# Patient Record
Sex: Male | Born: 1975 | Hispanic: No | Marital: Single | Smoking: Never smoker
Health system: Southern US, Community
[De-identification: ages and names within clinical notes are randomized; demographics above are authoritative.]

## PROBLEM LIST (undated history)

## (undated) DIAGNOSIS — E119 Type 2 diabetes mellitus without complications: Secondary | ICD-10-CM

---

## 2008-02-12 ENCOUNTER — Emergency Department (HOSPITAL_COMMUNITY): Admission: EM | Admit: 2008-02-12 | Discharge: 2008-02-12 | Payer: Self-pay | Admitting: Emergency Medicine

## 2011-01-23 LAB — GLUCOSE, CAPILLARY: Glucose-Capillary: 298 — ABNORMAL HIGH

## 2013-01-23 ENCOUNTER — Emergency Department (HOSPITAL_COMMUNITY)
Admission: EM | Admit: 2013-01-23 | Discharge: 2013-01-23 | Disposition: A | Discharge status: Home / Self Care | Payer: Self-pay | Source: Home / Self Care | Attending: Emergency Medicine | Admitting: Emergency Medicine

## 2013-01-23 ENCOUNTER — Encounter (HOSPITAL_COMMUNITY): Payer: Self-pay | Admitting: Emergency Medicine

## 2013-01-23 DIAGNOSIS — L03039 Cellulitis of unspecified toe: Secondary | ICD-10-CM

## 2013-01-23 DIAGNOSIS — L03032 Cellulitis of left toe: Secondary | ICD-10-CM

## 2013-01-23 MED ORDER — CEPHALEXIN 500 MG PO CAPS: 500.0000 mg | ORAL_CAPSULE | Freq: Three times a day (TID) | ORAL | Status: AC

## 2013-01-23 MED ORDER — TRAMADOL HCL 50 MG PO TABS: 100.0000 mg | ORAL_TABLET | Freq: Three times a day (TID) | ORAL | Status: AC | PRN

## 2013-01-23 NOTE — ED Provider Notes (Signed)
Chief Complaint:   Chief Complaint  Patient presents with  . Toe Pain    History of Present Illness:   Ralph Bryant is a 37 year old male who has had a two-day history of pain, swelling, and tenderness to palpation of the medial nail fold of the left great toe. He denies any injury. It hurts to touch. There's been no purulent drainage. He's had no fever or chills, no history of gout, and no other joint involvement.  Review of Systems:  Other than noted above, the patient denies any of the following symptoms: Systemic:  No fevers, chills, or sweats.  No fatigue or tiredness. Musculoskeletal:  No joint pain, arthritis, bursitis, swelling, or back pain.  Neurological:  No muscular weakness, paresthesias.  PMFSH:  Past medical history, family history, social history, meds, and allergies were reviewed.  No history of gout.    Physical Exam:   Vital signs:  BP 133/90  Pulse 88  Temp(Src) 98.3 F (36.8 C) (Oral)  Resp 20  SpO2 100% Gen:  Alert and oriented times 3.  In no distress. Musculoskeletal:  Exam of the foot reveals a paronychia of the medial nail fold of the left great toe. There is a visible collection of pus at the corner of the nail fold. This was very tender to touch. There was no pain to palpation over the IP joint or the MTP joint foot exam was otherwise normal. There was no pus underneath the nail.  Otherwise, all joints had a full a ROM with no swelling, bruising or deformity.  No edema, pulses full. Extremities were warm and pink.  Capillary refill was brisk.  Skin:  Clear, warm and dry.  No rash. Neuro:  Alert and oriented times 3.  Muscle strength was normal.  Sensation was intact to light touch.   Procedure Note:  Verbal informed consent was obtained from the patient.  Risks and benefits were outlined with the patient.  Patient understands and accepts these risks.  Identity of the patient was confirmed verbally and by armband.    Procedure was performed as follows:  The  toe was prepped with alcohol. A single stab incision was made into the visible collection of pus using a #11 scalpel blade. A large amount of pus drained out. This was cultured. Antibiotic ointment and a Band-Aid dressing were applied.  Patient tolerated the procedure well without any immediate complications.  Assessment:  The encounter diagnosis was Paronychia of great toe of left foot.  Plan:   1.  Meds:  The following meds were prescribed:   Discharge Medication List as of 01/23/2013  2:48 PM    START taking these medications   Details  cephALEXin (KEFLEX) 500 MG capsule Take 1 capsule (500 mg total) by mouth 3 (three) times daily., Starting 01/23/2013, Until Discontinued, Normal    traMADol (ULTRAM) 50 MG tablet Take 2 tablets (100 mg total) by mouth every 8 (eight) hours as needed for pain., Starting 01/23/2013, Until Discontinued, Normal        2.  Patient Education/Counseling:  The patient was given appropriate handouts, self care instructions, and instructed in symptomatic relief including rest and activity, elevation, soaking in warm water, and application of antibiotic ointment.  3.  Follow up:  The patient was told to follow up if no better in 3 to 4 days, if becoming worse in any way, and given some red flag symptoms such as worsening pain, swelling, or fever which would prompt immediate return.  Follow up  here if necessary.       Reuben Likes, MD 01/23/13 [phone]

## 2013-01-23 NOTE — ED Notes (Signed)
Left great toe and pain, slight redness to toe.  Noticed pain 2 days ago

## 2013-01-27 LAB — CULTURE, ROUTINE-ABSCESS

## 2013-01-27 NOTE — ED Notes (Signed)
Abscess R toe: Few Staph Aureus.  Dr. Lorenz Coaster confirmed that pt. was adequately treated with Keflex. Ralph Bryant 01/27/2013

## 2019-10-03 ENCOUNTER — Emergency Department (HOSPITAL_COMMUNITY)
Admission: EM | Admit: 2019-10-03 | Discharge: 2019-10-03 | Disposition: A | Discharge status: Home / Self Care | Payer: Self-pay | Attending: Emergency Medicine | Admitting: Emergency Medicine

## 2019-10-03 ENCOUNTER — Encounter (HOSPITAL_COMMUNITY): Payer: Self-pay

## 2019-10-03 ENCOUNTER — Emergency Department (HOSPITAL_COMMUNITY): Payer: Self-pay

## 2019-10-03 ENCOUNTER — Other Ambulatory Visit: Payer: Self-pay

## 2019-10-03 DIAGNOSIS — R1084 Generalized abdominal pain: Secondary | ICD-10-CM

## 2019-10-03 DIAGNOSIS — I10 Essential (primary) hypertension: Secondary | ICD-10-CM

## 2019-10-03 DIAGNOSIS — E1165 Type 2 diabetes mellitus with hyperglycemia: Secondary | ICD-10-CM | POA: Insufficient documentation

## 2019-10-03 DIAGNOSIS — Z20822 Contact with and (suspected) exposure to covid-19: Secondary | ICD-10-CM | POA: Insufficient documentation

## 2019-10-03 DIAGNOSIS — Z7984 Long term (current) use of oral hypoglycemic drugs: Secondary | ICD-10-CM | POA: Insufficient documentation

## 2019-10-03 DIAGNOSIS — R739 Hyperglycemia, unspecified: Secondary | ICD-10-CM

## 2019-10-03 DIAGNOSIS — Z79899 Other long term (current) drug therapy: Secondary | ICD-10-CM | POA: Insufficient documentation

## 2019-10-03 DIAGNOSIS — R509 Fever, unspecified: Secondary | ICD-10-CM

## 2019-10-03 HISTORY — DX: Type 2 diabetes mellitus without complications: E11.9

## 2019-10-03 LAB — CBC
HCT: 43.3 % (ref 39.0–52.0)
Hemoglobin: 14.5 g/dL (ref 13.0–17.0)
MCH: 27.4 pg (ref 26.0–34.0)
MCHC: 33.5 g/dL (ref 30.0–36.0)
MCV: 81.7 fL (ref 80.0–100.0)
Platelets: 223 10*3/uL (ref 150–400)
RBC: 5.3 MIL/uL (ref 4.22–5.81)
RDW: 12.4 % (ref 11.5–15.5)
WBC: 5.7 10*3/uL (ref 4.0–10.5)
nRBC: 0 % (ref 0.0–0.2)

## 2019-10-03 LAB — COMPREHENSIVE METABOLIC PANEL
ALT: 26 U/L (ref 0–44)
AST: 24 U/L (ref 15–41)
Albumin: 3.5 g/dL (ref 3.5–5.0)
Alkaline Phosphatase: 95 U/L (ref 38–126)
Anion gap: 13 (ref 5–15)
BUN: 12 mg/dL (ref 6–20)
CO2: 20 mmol/L — ABNORMAL LOW (ref 22–32)
Calcium: 9.4 mg/dL (ref 8.9–10.3)
Chloride: 100 mmol/L (ref 98–111)
Creatinine, Ser: 0.93 mg/dL (ref 0.61–1.24)
GFR calc Af Amer: >60 mL/min (ref >60)
GFR calc non Af Amer: >60 mL/min (ref >60)
Glucose, Bld: 323 mg/dL — ABNORMAL HIGH (ref 70–99)
Potassium: 4 mmol/L (ref 3.5–5.1)
Sodium: 133 mmol/L — ABNORMAL LOW (ref 135–145)
Total Bilirubin: 0.6 mg/dL (ref 0.3–1.2)
Total Protein: 7.6 g/dL (ref 6.5–8.1)

## 2019-10-03 LAB — URINALYSIS, ROUTINE W REFLEX MICROSCOPIC
Bacteria, UA: NONE SEEN
Bilirubin Urine: NEGATIVE
Glucose, UA: >=500 mg/dL — AB
Hgb urine dipstick: NEGATIVE
Ketones, ur: NEGATIVE mg/dL
Leukocytes,Ua: NEGATIVE
Nitrite: NEGATIVE
Protein, ur: 100 mg/dL — AB
Specific Gravity, Urine: 1.038 — ABNORMAL HIGH (ref 1.005–1.030)
pH: 5 (ref 5.0–8.0)

## 2019-10-03 LAB — LIPASE, BLOOD: Lipase: 19 U/L (ref 11–51)

## 2019-10-03 LAB — SARS CORONAVIRUS 2 BY RT PCR (HOSPITAL ORDER, PERFORMED IN ~~LOC~~ HOSPITAL LAB): SARS Coronavirus 2: NEGATIVE

## 2019-10-03 MED ORDER — LACTATED RINGERS IV BOLUS
1000.0000 mL | Freq: Once | INTRAVENOUS | Status: AC
Start: 2019-10-03 — End: 2019-10-03
  Administered 2019-10-03: 1000 mL via INTRAVENOUS

## 2019-10-03 MED ORDER — IOHEXOL 300 MG/ML  SOLN
100.0000 mL | Freq: Once | INTRAMUSCULAR | Status: AC | PRN
  Administered 2019-10-03: 100 mL via INTRAVENOUS

## 2019-10-03 MED ORDER — SODIUM CHLORIDE 0.9% FLUSH
3.0000 mL | Freq: Once | INTRAVENOUS | Status: AC
  Administered 2019-10-03: 3 mL via INTRAVENOUS

## 2019-10-03 MED ORDER — ONDANSETRON HCL 4 MG/2ML IJ SOLN
4.0000 mg | Freq: Once | INTRAMUSCULAR | Status: AC
  Administered 2019-10-03: 4 mg via INTRAVENOUS
  Filled 2019-10-03: qty 2

## 2019-10-03 MED ORDER — LACTATED RINGERS IV BOLUS
500.0000 mL | Freq: Once | INTRAVENOUS | Status: AC
  Administered 2019-10-03: 500 mL via INTRAVENOUS

## 2019-10-03 MED ORDER — FENTANYL CITRATE (PF) 100 MCG/2ML IJ SOLN
50.0000 ug | Freq: Once | INTRAMUSCULAR | Status: AC
  Administered 2019-10-03: 50 ug via INTRAVENOUS
  Filled 2019-10-03: qty 2

## 2019-10-03 MED ORDER — ACETAMINOPHEN 325 MG PO TABS
650.0000 mg | ORAL_TABLET | Freq: Once | ORAL | Status: AC
  Administered 2019-10-03: 650 mg via ORAL
  Filled 2019-10-03: qty 2

## 2019-10-03 NOTE — ED Provider Notes (Signed)
[facility] EMERGENCY DEPARTMENT Provider Note   CSN: [id number] Arrival date & time: 10/03/19  1637     History Chief Complaint  Patient presents with  . Abdominal Pain    Ralph Bryant is a 44 y.o. male.  Patient presented to the ED with several days of abdominal pain, was found to be febrile in triage.  Patient denies any nausea, vomiting.  Patient states he may have some intermittent loose stools or constipation.  The history is provided by the patient.  Illness Location:  Abdomen Quality:  Pain Severity:  Mild Onset quality:  Gradual Timing:  Constant Progression:  Unchanged Chronicity:  New Context:  No specific etiology, no history of abdominal surgeries Relieved by:  Nothing tried Worsened by:  Nothing Ineffective treatments:  None tried Associated symptoms: abdominal pain and diarrhea   Associated symptoms: no chest pain, no congestion, no cough, no fever, no headaches, no loss of consciousness, no nausea, no shortness of breath and no vomiting        Past Medical History:  Diagnosis Date  . Diabetes mellitus without complication (HCC)     There are no problems to display for this patient.   History reviewed. No pertinent surgical history.     No family history on file.  Social History   Tobacco Use  . Smoking status: Never Smoker  Substance Use Topics  . Alcohol use: No  . Drug use: No    Home Medications Prior to Admission medications   Medication Sig Start Date End Date Taking? Authorizing Provider  metFORMIN (GLUCOPHAGE-XR) 500 MG 24 hr tablet Take 1,000 mg by mouth 2 (two) times daily. 09/22/19  Yes [provider]  cephALEXin (KEFLEX) 500 MG capsule Take 1 capsule (500 mg total) by mouth 3 (three) times daily. Patient not taking: Reported on 10/03/2019 01/23/13   Reuben Likes, MD  traMADol (ULTRAM) 50 MG tablet Take 2 tablets (100 mg total) by mouth every 8 (eight) hours as needed for pain. Patient not taking:  Reported on 10/03/2019 01/23/13   Reuben Likes, MD    Allergies    Patient has no known allergies.  Review of Systems   Review of Systems  Constitutional: Negative for fever.  HENT: Negative for congestion.   Respiratory: Negative for cough and shortness of breath.   Cardiovascular: Negative for chest pain.  Gastrointestinal: Positive for abdominal pain and diarrhea. Negative for nausea and vomiting.  Neurological: Negative for loss of consciousness and headaches.  All other systems reviewed and are negative.   Physical Exam Updated Vital Signs BP (!) 175/106   Pulse 99   Temp 99.9 F (37.7 C) (Oral)   Resp 20   SpO2 96%   Physical Exam Vitals and nursing note reviewed.  Constitutional:      General: He is not in acute distress.    Appearance: He is well-developed.    HENT:     Head: Normocephalic and atraumatic.  Eyes:     Conjunctiva/sclera: Conjunctivae normal.  Cardiovascular:     Rate and Rhythm: Normal rate and regular rhythm.     Heart sounds: No murmur heard.   Pulmonary:     Effort: Pulmonary effort is normal. No respiratory distress.     Breath sounds: Normal breath sounds.  Abdominal:     General: Bowel sounds are normal. There is no distension. There are no signs of injury.     Palpations: Abdomen is soft.     Tenderness: There  is no abdominal tenderness. There is no right CVA tenderness or left CVA tenderness.  Musculoskeletal:     Cervical back: Neck supple.  Skin:    General: Skin is warm and dry.  Neurological:     General: No focal deficit present.     Mental Status: He is alert and oriented to person, place, and time.  Psychiatric:        Mood and Affect: Mood normal.        Behavior: Behavior normal.     ED Results / Procedures / Treatments   Labs (all labs ordered are listed, but only abnormal results are displayed) Labs Reviewed  COMPREHENSIVE METABOLIC PANEL - Abnormal; Notable for the following components:      Result Value    Sodium 133 (*)    CO2 20 (*)    Glucose, Bld 323 (*)    All other components within normal limits  URINALYSIS, ROUTINE W REFLEX MICROSCOPIC - Abnormal; Notable for the following components:   Specific Gravity, Urine 1.038 (*)    Glucose, UA >=500 (*)    Protein, ur 100 (*)    All other components within normal limits  SARS CORONAVIRUS 2 BY RT PCR (HOSPITAL ORDER, PERFORMED IN [facility])  LIPASE, BLOOD  CBC    EKG None  Radiology CT ABDOMEN PELVIS W CONTRAST  Result Date: 10/03/2019 CLINICAL DATA:  Abdominal pain.  Fever. EXAM: CT ABDOMEN AND PELVIS WITH CONTRAST TECHNIQUE: Multidetector CT imaging of the abdomen and pelvis was performed using the standard protocol following bolus administration of intravenous contrast. CONTRAST:  OMNIPAQUE IOHEXOL 300 MG/ML  SOLN COMPARISON:  None. FINDINGS: Lower chest: The lung bases are clear. The heart size is normal. Hepatobiliary: The liver is enlarged measuring approximately 23 cm craniocaudad. There are findings suspicious for underlying hepatic steatosis. The gallbladder is unremarkable.There is no biliary ductal dilation. Pancreas: Normal contours without ductal dilatation. No peripancreatic fluid collection. Spleen: The spleen is enlarged measuring approximately 14 cm craniocaudad. Adrenals/Urinary Tract: --Adrenal glands: Unremarkable. --Right kidney/ureter: No hydronephrosis or radiopaque kidney stones. --Left kidney/ureter: No hydronephrosis or radiopaque kidney stones. --Urinary bladder: Unremarkable. Stomach/Bowel: --Stomach/Duodenum: There is a small hiatal hernia. --Small bowel: Unremarkable. --Colon: Unremarkable. --Appendix: Normal. Vascular/Lymphatic: Normal course and caliber of the major abdominal vessels. --No retroperitoneal lymphadenopathy. --No mesenteric lymphadenopathy. --No pelvic or inguinal lymphadenopathy. Reproductive: Unremarkable Other: No ascites or free air. There is a fat containing right inguinal  hernia. Musculoskeletal. No acute displaced fractures. There is a small subcutaneous defect in the patient's left gluteal region with some mild overlying skin thickening (axial series 3, image 82). IMPRESSION: 1. No acute abdominopelvic abnormality. 2. Small subcutaneous defect involving the left gluteal region with mild overlying skin thickening. Clinical correlation is recommended. There is no associated drainable fluid collection or mass. 3. Hepatosplenomegaly with findings suspicious for underlying hepatic steatosis. 4. Small hiatal hernia. 5. Fat containing right inguinal hernia. Electronically Signed   By: Katherine Mantle M.D.   On: 10/03/2019 21:46   DG Chest Portable 1 View  Result Date: 10/03/2019 CLINICAL DATA:  Fever EXAM: PORTABLE CHEST 1 VIEW COMPARISON:  None. FINDINGS: Low lung volumes. No confluent airspace opacities. Heart size is borderline. No effusions or acute bony abnormality. IMPRESSION: Low lung volumes.  No acute cardiopulmonary disease. Electronically Signed   By: Charlett Nose M.D.   On: 10/03/2019 22:41    Procedures Procedures (including critical care time)  Medications Ordered in ED Medications  sodium chloride flush (NS) 0.9 %  injection 3 mL (3 mLs Intravenous Given 10/03/19 2225)  acetaminophen (TYLENOL) tablet 650 mg (650 mg Oral Given 10/03/19 1654)  lactated ringers bolus 1,000 mL (0 mLs Intravenous Stopped 10/03/19 2225)  fentaNYL (SUBLIMAZE) injection 50 mcg (50 mcg Intravenous Given 10/03/19 2108)  ondansetron (ZOFRAN) injection 4 mg (4 mg Intravenous Given 10/03/19 2108)  iohexol (OMNIPAQUE) 300 MG/ML solution 100 mL (100 mLs Intravenous Contrast Given 10/03/19 2131)  lactated ringers bolus 500 mL (0 mLs Intravenous Stopped 10/03/19 2323)    ED Course  I have reviewed the triage vital signs and the nursing notes.  Pertinent labs & imaging results that were available during my care of the patient were reviewed by me and considered in my medical decision making  (see chart for details).    MDM Rules/Calculators/A&P                          Differential diagnosis: Fever in adult patient, viral illness, COVID-19, diverticulitis, appendicitis, kidney stone, cellulitis  ED physician interpretation of imaging: Patient chest x-ray out hemopneumothorax, widened mediastinum, enlarged heart border or focal pneumonia.  Patient CT without any acute findings to explain patient's pain. ED physician interpretation of labs: Patient CBC, CMP, lipase, COVID-19, urine all unremarkable for signs of systemic infection.  MDM: Patient is a 44 year old male presented to ED with several days of abdominal pain found to be febrile in triage with unremarkable lab work, chest x-ray and CT scan with fever from a likely viral cause with stable vital signs appropriate for discharge home with symptomatic treatment.  Patient's vital signs are stable, patient is febrile.  Patient's physical exam is unremarkable, no peritoneal signs.  Abdomen is soft with minimal to no tenderness.  No CVA tenderness.  Patient area concern on CT scan on the left gluteal region is an old scar as described in physical exam.  Patient's pain treated with fentanyl, fever with acetaminophen with improvement, patient given IV fluids.  Based on patient's general myalgias and fever without source most likely viral in nature.  Patient given strict return precautions, as well as recommendations for close outpatient follow-up for his chronic medical conditions including hypertension and hyperglycemia.  Key discharge instructions: Do not take your metformin for 48 hours after your CT scan.  Your lab work showed hyperglycemia without diabetic ketoacidosis.  Your CT of your abdomen and pelvis showed no acute pathology, no appendicitis, no diverticulitis, no kidney stone.  Your chest x-ray did not show any signs of upper respiratory infection.  Your urine showed no signs of infection.  You likely have worsening  diabetes.  Recommend following up your primary care physician if you do not have one you as the information is discharge paperwork to follow-up.  Additionally you have hypertension, if you are not being treated for this follow-up with your PCP for further treatment.  You were Covid swab, follow-up on MyChart online to check up your results.  If it is positive you may be called with results.   Final Clinical Impression(s) / ED Diagnoses Final diagnoses:  Fever, unspecified fever cause  Generalized abdominal pain  Hyperglycemia  Hypertension, unspecified type    Rx / DC Orders ED Discharge Orders    None       Delma Post, MD 10/04/19 0127    Margette Fast, MD 10/06/19 1948

## 2019-10-03 NOTE — ED Triage Notes (Addendum)
Pt arrives to ED w/ c/o generalized abdominal pain 6/10 x 3 days. Pt w/ fever in triage. Pt denies n/v/d. Pt denies cough, sob.

## 2019-10-03 NOTE — ED Notes (Signed)
Discharge instructions reviewed with pt. Pt verbalized understanding.   

## 2019-10-03 NOTE — ED Notes (Signed)
Pt transported to CT ?

## 2019-10-03 NOTE — Discharge Instructions (Addendum)
Do not take your metformin for 48 hours after your CT scan.  Your lab work showed hyperglycemia without diabetic ketoacidosis.  Your CT of your abdomen and pelvis showed no acute pathology, no appendicitis, no diverticulitis, no kidney stone.  Your chest x-ray did not show any signs of upper respiratory infection.  Your urine showed no signs of infection.  You likely have worsening diabetes.  Recommend following up your primary care physician if you do not have one you as the information is discharge paperwork to follow-up.  Additionally you have hypertension, if you are not being treated for this follow-up with your PCP for further treatment.  You were Covid swab, follow-up on MyChart online to check up your results.  If it is positive you may be called with results.

## 2019-10-04 NOTE — ED Notes (Signed)
RN wasted OF Fentanyl, witnessed by Dalbert Batman, RN. Pyxis not allowing RN to waste.

## 2021-11-05 IMAGING — CT CT ABD-PELV W/ CM
2 of 5 series · 16 of 46 positions shown, 18 images · IV contrast (Omni 300)
Comparison: None.

CLINICAL DATA: Abdominal pain.  Fever.

EXAM:
CT ABDOMEN AND PELVIS WITH CONTRAST
TECHNIQUE: Multidetector CT imaging of the abdomen and pelvis was performed
using the standard protocol following bolus administration of
intravenous contrast.
CONTRAST:  100mL OMNIPAQUE IOHEXOL 300 MG/ML  SOLN

[Series 3: a/p w/ 5mm · axial · 0.96mm/px · z∈[-180,+315]mm · 13 of 111 slices shown, 15 images]
[im 6/111  soft-tissue]
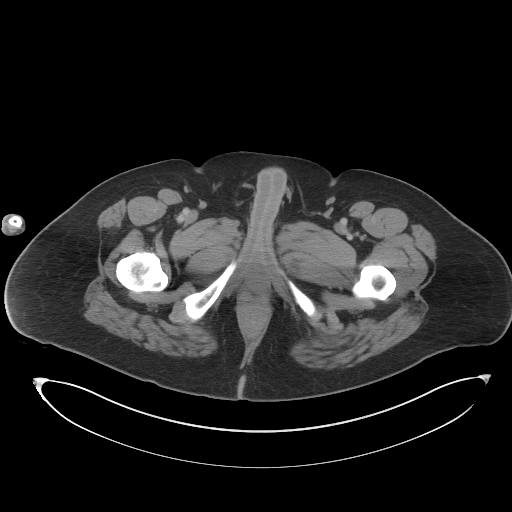
[im 6/111  bone]
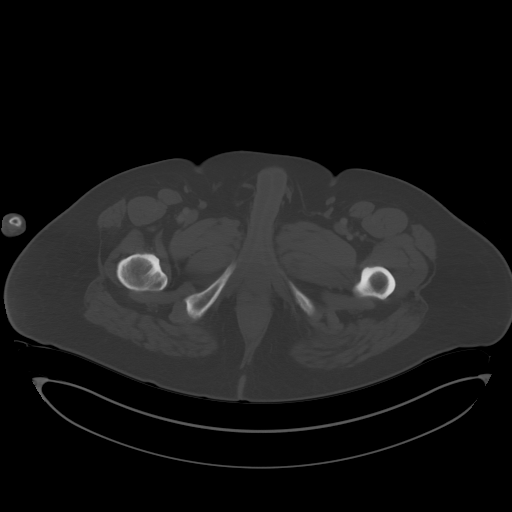
[im 18/111  soft-tissue]
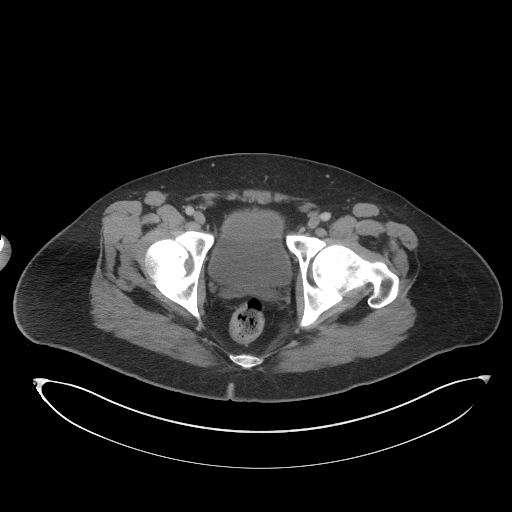
[im 24/111  soft-tissue]
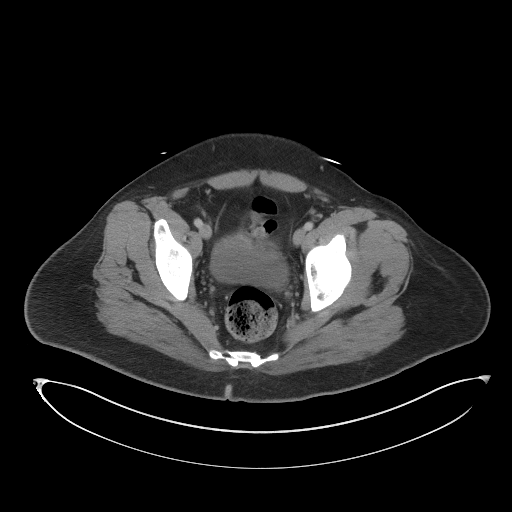
[im 29/111  soft-tissue]
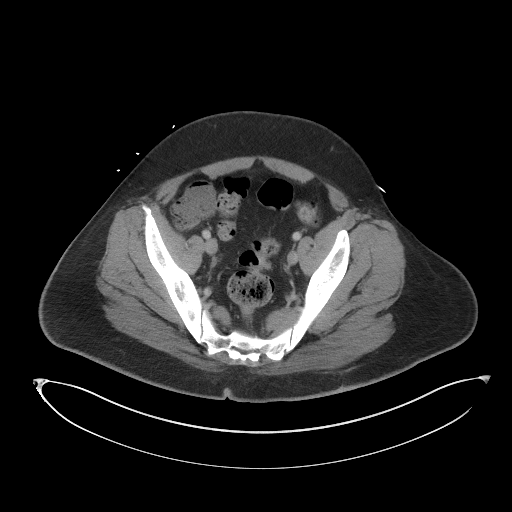
[im 41/111  soft-tissue]
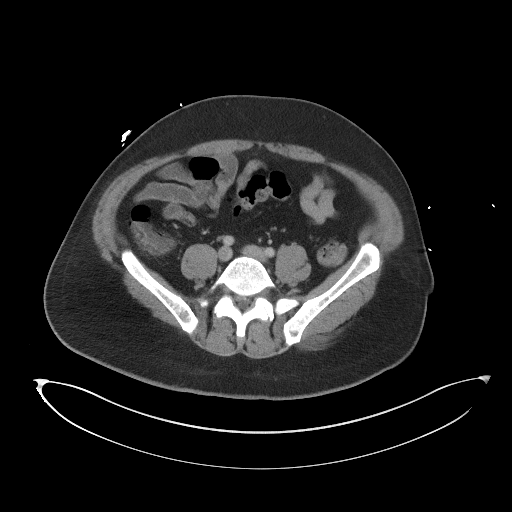
[im 47/111  soft-tissue]
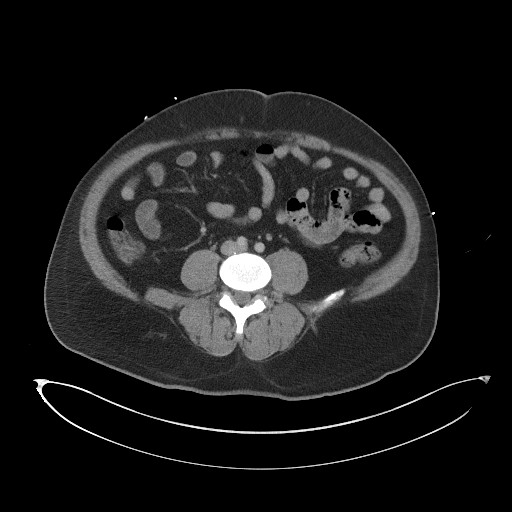
[im 58/111  soft-tissue]
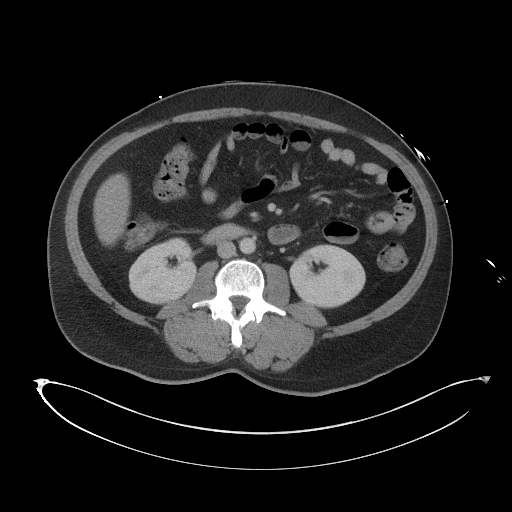
[im 64/111  soft-tissue]
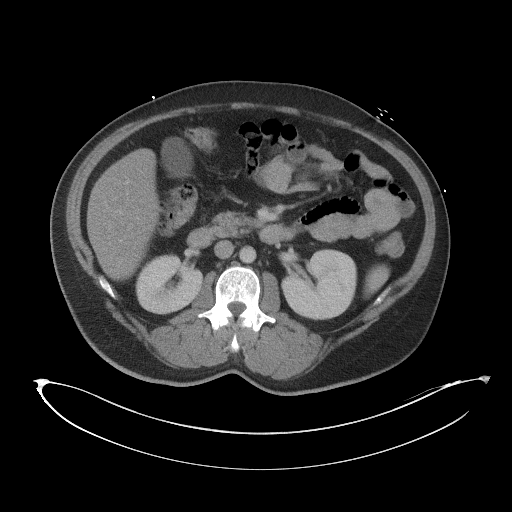
[im 70/111  soft-tissue]
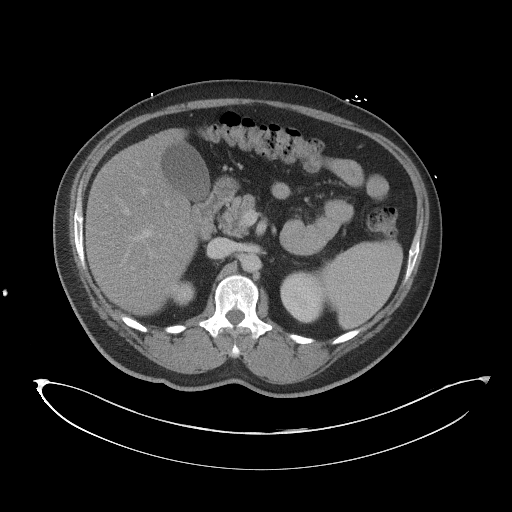
[im 70/111  bone]
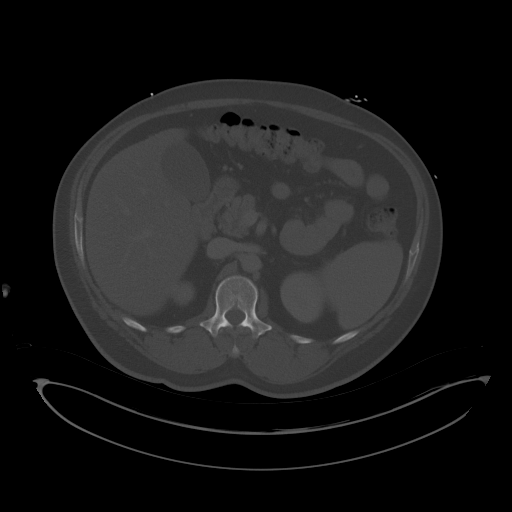
[im 82/111  soft-tissue]
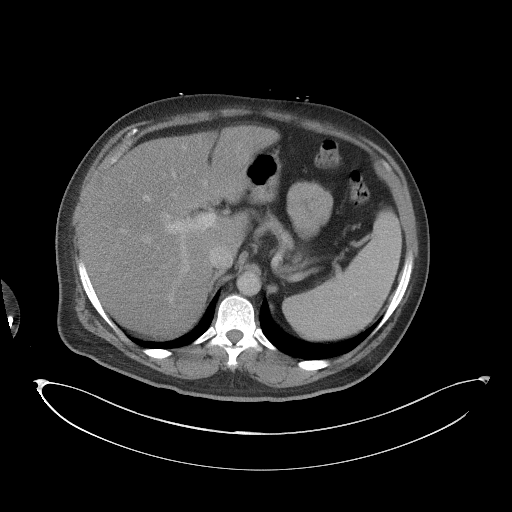
[im 87/111  soft-tissue]
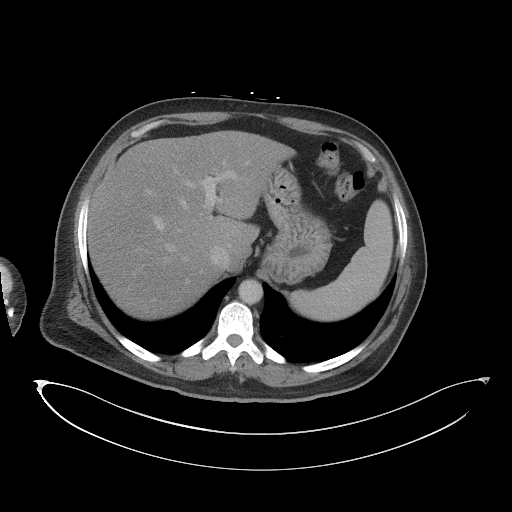
[im 93/111  soft-tissue]
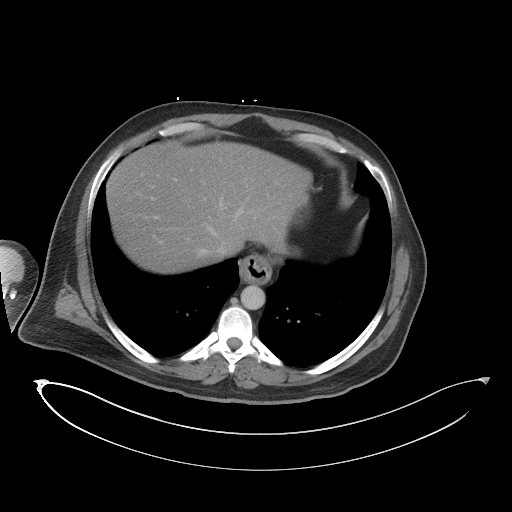
[im 105/111  soft-tissue]
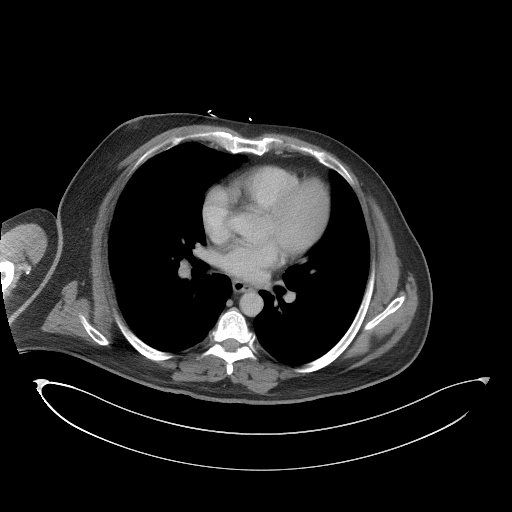

[Series 6: a/p w/ cor · coronal · 0.94mm/px · 3 of 175 slices shown]
[im 59/175  soft-tissue]
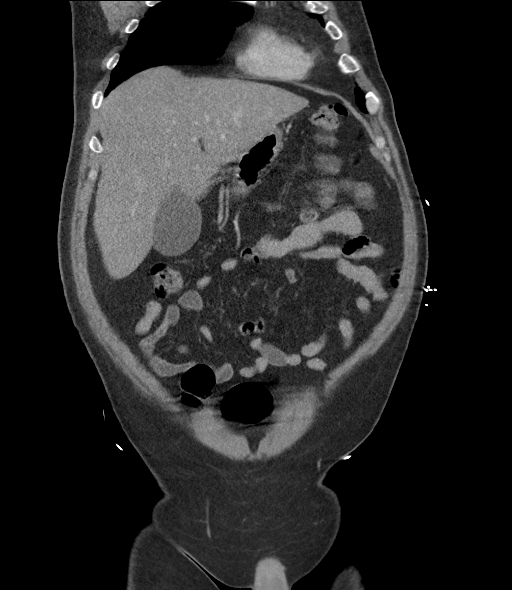
[im 78/175  soft-tissue]
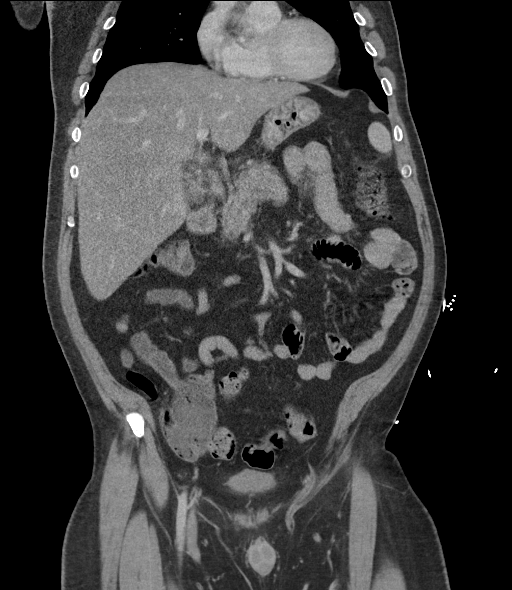
[im 97/175  soft-tissue]
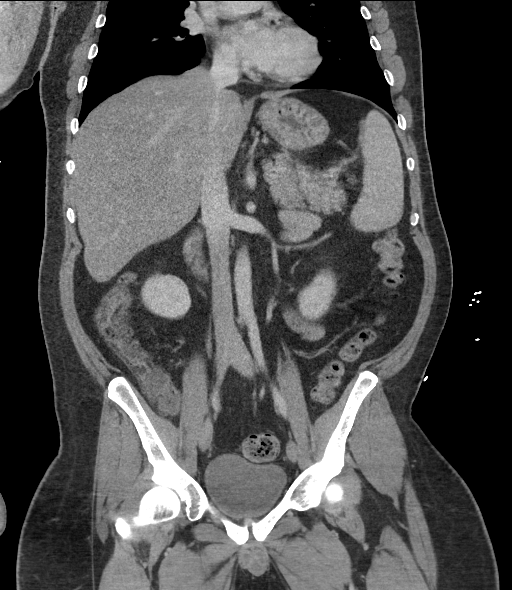

[16 of 46 positions shown; findings below may reference images not displayed]

FINDINGS: Lower chest: The lung bases are clear. The heart size is normal.

Hepatobiliary: The liver is enlarged measuring approximately 23 cm
craniocaudad. There are findings suspicious for underlying hepatic
steatosis. The gallbladder is unremarkable.There is no biliary
ductal dilation.

Pancreas: Normal contours without ductal dilatation. No
peripancreatic fluid collection.

Spleen: The spleen is enlarged measuring approximately 14 cm
craniocaudad.

Adrenals/Urinary Tract:

--Adrenal glands: Unremarkable.

--Right kidney/ureter: No hydronephrosis or radiopaque kidney
stones.

--Left kidney/ureter: No hydronephrosis or radiopaque kidney stones.

--Urinary bladder: Unremarkable.

Stomach/Bowel:

--Stomach/Duodenum: There is a small hiatal hernia.

--Small bowel: Unremarkable.

--Colon: Unremarkable.

--Appendix: Normal.

Vascular/Lymphatic: Normal course and caliber of the major abdominal
vessels.

--No retroperitoneal lymphadenopathy.

--No mesenteric lymphadenopathy.

--No pelvic or inguinal lymphadenopathy.

Reproductive: Unremarkable

Other: No ascites or free air. There is a fat containing right
inguinal hernia.

Musculoskeletal. No acute displaced fractures. There is a small
subcutaneous defect in the patient's left gluteal region with some
mild overlying skin thickening (axial series 3, image 82).
IMPRESSION: 1. No acute abdominopelvic abnormality.
2. Small subcutaneous defect involving the left gluteal region with
mild overlying skin thickening. Clinical correlation is recommended.
There is no associated drainable fluid collection or mass.
3. Hepatosplenomegaly with findings suspicious for underlying
hepatic steatosis.
4. Small hiatal hernia.
5. Fat containing right inguinal hernia.
